# Patient Record
Sex: Female | Born: 1990 | Race: Asian | Hispanic: No | Marital: Single | Smoking: Never smoker
Health system: Southern US, Community
[De-identification: ages and names within clinical notes are randomized; demographics above are authoritative.]

---

## 2016-08-20 ENCOUNTER — Ambulatory Visit (INDEPENDENT_AMBULATORY_CARE_PROVIDER_SITE_OTHER): Payer: Managed Care, Other (non HMO) | Admitting: Physician Assistant

## 2016-08-20 ENCOUNTER — Ambulatory Visit (INDEPENDENT_AMBULATORY_CARE_PROVIDER_SITE_OTHER): Payer: Managed Care, Other (non HMO)

## 2016-08-20 ENCOUNTER — Encounter: Payer: Self-pay | Admitting: Physician Assistant

## 2016-08-20 VITALS — BP 116/80 | HR 79 | Temp 98.3°F | Resp 17 | Ht 60.5 in | Wt 137.0 lb

## 2016-08-20 DIAGNOSIS — R112 Nausea with vomiting, unspecified: Secondary | ICD-10-CM

## 2016-08-20 DIAGNOSIS — R0781 Pleurodynia: Secondary | ICD-10-CM

## 2016-08-20 DIAGNOSIS — R0789 Other chest pain: Secondary | ICD-10-CM | POA: Diagnosis not present

## 2016-08-20 LAB — POCT URINALYSIS DIP (MANUAL ENTRY)
BILIRUBIN UA: NEGATIVE
BILIRUBIN UA: NEGATIVE mg/dL
Blood, UA: NEGATIVE
GLUCOSE UA: NEGATIVE mg/dL
Nitrite, UA: NEGATIVE
Spec Grav, UA: 1.015 (ref 1.010–1.025)
Urobilinogen, UA: 0.2 E.U./dL
pH, UA: 7.5 (ref 5.0–8.0)

## 2016-08-20 LAB — POC MICROSCOPIC URINALYSIS (UMFC): MUCUS RE: ABSENT

## 2016-08-20 LAB — POCT URINE PREGNANCY: PREG TEST UR: NEGATIVE

## 2016-08-20 MED ORDER — OMEPRAZOLE 40 MG PO CPDR: 40.0000 mg | DELAYED_RELEASE_CAPSULE | Freq: Every day | ORAL | 1 refills | Status: AC

## 2016-08-20 MED ORDER — RANITIDINE HCL 150 MG PO TABS: 300.0000 mg | ORAL_TABLET | Freq: Every day | ORAL | 1 refills | Status: AC

## 2016-08-20 NOTE — Patient Instructions (Addendum)
I would like you to start omeprazole daily in the morning and zantac two tablets at night for the next 2 weeks for any possible underlying gastritis or ulcer. You may need additional therapy for 2 weeks but I want to see you in office at 2 weeks to see how you are doing. I would like you to use tylenol for musculoskeletal pain. I have ordered labs and will know the results in a few days. I will contact you with these lab results. I would also like to have an right upper quadrant ultrasound to ensure that your gallbladder is okay. Please let me know if any symptoms worsen over the next 2 weeks with this treatment, if so return sooner.    Peptic Ulcer A peptic ulcer is a painful sore in the lining of your esophagus, stomach, or the first part of your small intestine. You may have pain in the area between your chest and your belly button. The most common causes of an ulcer are:  An infection.  Using certain pain medicines too often or too much. Follow these instructions at home:  Avoid alcohol.  Avoid caffeine.  Do not use any tobacco products. These include cigarettes, chewing tobacco, and e-cigarettes. If you need help quitting, ask your doctor.  Take over-the-counter and prescription medicines only as told by your doctor. Do not stop or change your medicines unless you talk with your doctor about it first.  Keep all follow-up visits as told by your doctor. This is important. Contact a doctor if:  You do not get better in 7 days after you start treatment.  You keep having an upset stomach (indigestion) or heartburn. Get help right away if:  You have sudden, sharp pain in your belly (abdomen).  You have lasting belly pain.  You have bloody poop (stool) or black, tarry poop.  You throw up (vomit) blood. It may look like coffee grounds.  You feel light-headed or feel like you may pass out (faint).  You get weak.  You get sweaty or feel sticky and cold to the touch (clammy). This  information is not intended to replace advice given to you by your health care provider. Make sure you discuss any questions you have with your health care provider. Document Released: 06/24/2009 Document Revised: 08/14/2015 Document Reviewed: 12/29/2014 Elsevier Interactive Patient Education  2017 [employer]  Gastritis, Adult Gastritis is swelling (inflammation) of the stomach. When you have this condition, you can have these problems (symptoms):  Pain in your stomach.  A burning feeling in your stomach.  Feeling sick to your stomach (nauseous).  Throwing up (vomiting).  Feeling too full after you eat. It is important to get help for this condition. Without help, your stomach can bleed, and you can get sores (ulcers) in your stomach. Follow these instructions at home:  Take over-the-counter and prescription medicines only as told by your doctor.  If you were prescribed an antibiotic medicine, take it as told by your doctor. Do not stop taking it even if you start to feel better.  Drink enough fluid to keep your pee (urine) clear or pale yellow.  Instead of eating big meals, eat small meals often. Contact a health care provider if:  Your problems get worse.  Your problems go away and then come back. Get help right away if:  You throw up blood or something that looks like coffee grounds.  You have black or dark red poop (stools).  You cannot keep fluids down.  Your stomach pain gets worse.  You have a fever.  You do not feel better after 1 week. This information is not intended to replace advice given to you by your health care provider. Make sure you discuss any questions you have with your health care provider. Document Released: 09/16/2007 Document Revised: 11/27/2015 Document Reviewed: 12/22/2014 Elsevier Interactive Patient Education  2017 [employer].   IF you received an x-ray today, you will receive an invoice from [facility]. Please contact  [facility] at [phone] with questions or concerns regarding your invoice.   IF you received labwork today, you will receive an invoice from [place]. Please contact [facility] at [phone] with questions or concerns regarding your invoice.   Our billing staff will not be able to assist you with questions regarding bills from these companies.  You will be contacted with the lab results as soon as they are available. The fastest way to get your results is to activate your My Chart account. Instructions are located on the last page of this paperwork. If you have not heard from [place] regarding the results in 2 weeks, please contact this office.

## 2016-08-20 NOTE — Progress Notes (Signed)
Subjective:     Mary Ortega is a 26 y.o. female who presents for evaluation of lower rib pain and thoracic back pain. Onset was 4 weeks ago. Symptoms have been unchanged. The pain is described as sharp, and is 6/10 in intensity. Pain is located in the costovertebral angle bilaterally with radiation to back.  Aggravating factors: activity, sitting up, fatty foods and spicy foods.  Alleviating factors: none. Associated symptoms: belching, nausea and vomiting. Notes she feels nauseous after eating certain foods and will vomit up the food if she eats too much. The patient denies heavy lifting, abdominal pain, heartburn, constipation, diarrhea, dysuria, frequency, hematuria, hemetemisis, melena and sweats. Denies smoking. Has no FH of heart disease. She just recently moved here from [facility] (4 weeks ago). Saw a doctor in [place] a couple of months ago for the back pain and was treated with meloxicam but notes it did not help and actually made it worse. Denies alochoul use. Has tried heating pad with mild relief. Of note, pt's menstrual cycles are typically regular. They typically last 5-6 days. Denies menorrhagia and dysmenorrhea. LMP was 07/22/16. She is not sexually active, has never been sexually active.   There is a language barrier.   Review of Systems  Constitutional: Negative for chills, diaphoresis and fever.  Respiratory: Negative for cough, shortness of breath and wheezing.   Cardiovascular: Negative for palpitations and leg swelling.   Social History   Social History  . Marital status: Single    Spouse name: N/A  . Number of children: N/A  . Years of education: N/A   Occupational History  . Not on file.   Social History Main Topics  . Smoking status: Never Smoker  . Smokeless tobacco: Never Used  . Alcohol use No  . Drug use: No  . Sexual activity: Not on file   Other Topics Concern  . Not on file   Social History Narrative  . No narrative on file   History reviewed. No  pertinent surgical history.  History reviewed. No pertinent past medical history.   Objective:  Blood pressure 116/80, pulse 79, temperature 98.3 F (36.8 C), temperature source Oral, resp. rate 17, height 5' 0.5" (1.537 m), weight 137 lb (62.1 kg), last menstrual period 07/22/2016, SpO2 99 %.   Physical Exam  Constitutional: She is oriented to person, place, and time and well-developed, well-nourished, and in no distress.  HENT:  Head: Normocephalic and atraumatic.  Eyes: Conjunctivae are normal.  Neck: Normal range of motion.  Cardiovascular: Normal rate, regular rhythm and normal heart sounds.   Pulmonary/Chest: Effort normal and breath sounds normal. She exhibits tenderness and bony tenderness.    Abdominal: Soft. Normal appearance and bowel sounds are normal. She exhibits no distension. There is no tenderness. There is no rigidity, no guarding, no CVA tenderness, no tenderness at McBurney's point and negative Murphy's sign.  Musculoskeletal:       Thoracic back: She exhibits tenderness (of left sided musculature) and bony tenderness. She exhibits normal range of motion, no swelling and no spasm.  Neurological: She is alert and oriented to person, place, and time. Gait normal.  Skin: Skin is warm and dry.  Psychiatric: Affect normal.  Vitals reviewed.    Results for orders placed or performed in visit on 08/20/16 (from the past 24 hour(s))  POCT urinalysis dipstick     Status: Abnormal   Collection Time: 08/20/16  4:53 PM  Result Value Ref Range   Color, UA yellow yellow  Clarity, UA clear clear   Glucose, UA negative negative mg/dL   Bilirubin, UA negative negative   Ketones, POC UA negative negative mg/dL   Spec Grav, UA 1.015 1.010 - 1.025   Blood, UA negative negative   pH, UA 7.5 5.0 - 8.0   Protein Ur, POC trace (A) negative mg/dL   Urobilinogen, UA 0.2 0.2 or 1.0 E.U./dL   Nitrite, UA Negative Negative   Leukocytes, UA Trace (A) Negative  POCT urine pregnancy      Status: None   Collection Time: 08/20/16  4:53 PM  Result Value Ref Range   Preg Test, Ur Negative Negative  POCT Microscopic Urinalysis ([facility])     Status: Abnormal   Collection Time: 08/20/16  5:12 PM  Result Value Ref Range   WBC,UR,HPF,POC Moderate (A) None WBC/hpf   RBC,UR,HPF,POC None None RBC/hpf   Bacteria Many (A) None, Too numerous to count   Mucus Absent Absent   Epithelial Cells, UR Per Microscopy Moderate (A) None, Too numerous to count cells/hpf   Dg Chest 2 View  Result Date: 08/20/2016 CLINICAL DATA:  Pain with palpation. EXAM: CHEST  2 VIEW COMPARISON:  None. FINDINGS: The heart size and mediastinal contours are within normal limits. Both lungs are clear. The visualized skeletal structures are unremarkable. IMPRESSION: No active cardiopulmonary disease. Electronically Signed   By: Dorise Bullion III M.D   On: 08/20/2016 17:15    Assessment and Plan:  This case was precepted with Dr. Mitchel Honour.  1. Nausea and vomiting, intractability of vomiting not specified, unspecified vomiting type PE findings not consistent with symptom set. Unclear etiology for pt's symptoms. Differential includes musculoskeletal pain, GERD, PUD, gallbladder disease, gastritis, and other inflammatory processes. Will treat conservatively with PPI and H2 blocker to address symptom set. Instructed to avoid NSAID use. Take tylenol as needed for musculoskeletal pain. Plan to return in 2 weeks for further evaluation or sooner if symptoms worsen. - CBC with Differential/Platelet - CMP14+EGFR - Amylase - Lipase - POCT urinalysis dipstick - POCT Microscopic Urinalysis ([facility]) - POCT urine pregnancy - Urine culture - omeprazole (PRILOSEC) 40 MG capsule; Take 1 capsule (40 mg total) by mouth daily.  Dispense: 30 capsule; Refill: 1 - ranitidine (ZANTAC) 150 MG tablet; Take 2 tablets (300 mg total) by mouth at bedtime.  Dispense: 60 tablet; Refill: 1 - US Abdomen Limited RUQ; Future  2. Sternum pain - DG  Chest 2 View; Future 3. Rib pain - DG Chest 2 View; Future

## 2016-08-21 LAB — CBC WITH DIFFERENTIAL/PLATELET
BASOS ABS: 0 10*3/uL (ref 0.0–0.2)
Basos: 0 %
EOS (ABSOLUTE): 0.3 10*3/uL (ref 0.0–0.4)
Eos: 4 %
Hematocrit: 35 % (ref 34.0–46.6)
Hemoglobin: 11.4 g/dL (ref 11.1–15.9)
IMMATURE GRANULOCYTES: 0 %
Immature Grans (Abs): 0 10*3/uL (ref 0.0–0.1)
Lymphocytes Absolute: 2.2 10*3/uL (ref 0.7–3.1)
Lymphs: 30 %
MCH: 25.6 pg — ABNORMAL LOW (ref 26.6–33.0)
MCHC: 32.6 g/dL (ref 31.5–35.7)
MCV: 79 fL (ref 79–97)
Monocytes Absolute: 0.5 10*3/uL (ref 0.1–0.9)
Monocytes: 7 %
NEUTROS PCT: 59 %
Neutrophils Absolute: 4.3 10*3/uL (ref 1.4–7.0)
PLATELETS: 353 10*3/uL (ref 150–379)
RBC: 4.46 x10E6/uL (ref 3.77–5.28)
RDW: 13.7 % (ref 12.3–15.4)
WBC: 7.3 10*3/uL (ref 3.4–10.8)

## 2016-08-21 LAB — CMP14+EGFR
A/G RATIO: 1.3 (ref 1.2–2.2)
ALT: 21 IU/L (ref 0–32)
AST: 21 IU/L (ref 0–40)
Albumin: 4.5 g/dL (ref 3.5–5.5)
Alkaline Phosphatase: 89 IU/L (ref 39–117)
BUN/Creatinine Ratio: 14 (ref 9–23)
BUN: 10 mg/dL (ref 6–20)
Bilirubin Total: <0.2 mg/dL (ref 0.0–1.2)
CALCIUM: 9.5 mg/dL (ref 8.7–10.2)
CO2: 25 mmol/L (ref 18–29)
Chloride: 99 mmol/L (ref 96–106)
Creatinine, Ser: 0.7 mg/dL (ref 0.57–1.00)
GFR calc Af Amer: 139 mL/min/{1.73_m2} (ref >59)
GFR, EST NON AFRICAN AMERICAN: 121 mL/min/{1.73_m2} (ref >59)
GLUCOSE: 84 mg/dL (ref 65–99)
Globulin, Total: 3.5 g/dL (ref 1.5–4.5)
POTASSIUM: 4.8 mmol/L (ref 3.5–5.2)
Sodium: 137 mmol/L (ref 134–144)
TOTAL PROTEIN: 8 g/dL (ref 6.0–8.5)

## 2016-08-21 LAB — URINE CULTURE

## 2016-08-21 LAB — LIPASE: LIPASE: 64 U/L (ref 14–72)

## 2016-08-21 LAB — AMYLASE: Amylase: 150 U/L — ABNORMAL HIGH (ref 31–124)

## 2016-08-22 ENCOUNTER — Telehealth: Payer: Self-pay | Admitting: Physician Assistant

## 2016-08-22 NOTE — Telephone Encounter (Signed)
Pt states that she is burping a lot and she is not sure if it is a reaction from the medications.   Pt contact phone number [phone]

## 2016-08-26 NOTE — Telephone Encounter (Signed)
Pt states, burping sx's has resolved  Advised to call clinic if sx's restart

## 2016-09-02 ENCOUNTER — Ambulatory Visit
Admission: RE | Admit: 2016-09-02 | Discharge: 2016-09-02 | Disposition: A | Discharge status: Home / Self Care | Payer: Managed Care, Other (non HMO) | Source: Ambulatory Visit | Attending: Physician Assistant | Admitting: Physician Assistant

## 2016-09-02 DIAGNOSIS — R112 Nausea with vomiting, unspecified: Secondary | ICD-10-CM

## 2016-09-08 ENCOUNTER — Encounter: Payer: Self-pay | Admitting: Physician Assistant

## 2016-09-11 ENCOUNTER — Encounter: Payer: Self-pay | Admitting: Physician Assistant

## 2017-08-29 IMAGING — US US ABDOMEN LIMITED
1 series · 14 of 25 positions shown · non-contrast
Comparison: None.

CLINICAL DATA: Nausea vomiting.  Epigastric pain.

EXAM:
US ABDOMEN LIMITED - RIGHT UPPER QUADRANT

[Series 1: us abdomen limited · 0.17mm/px · 14 of 44 slices shown]
[im 1/44]
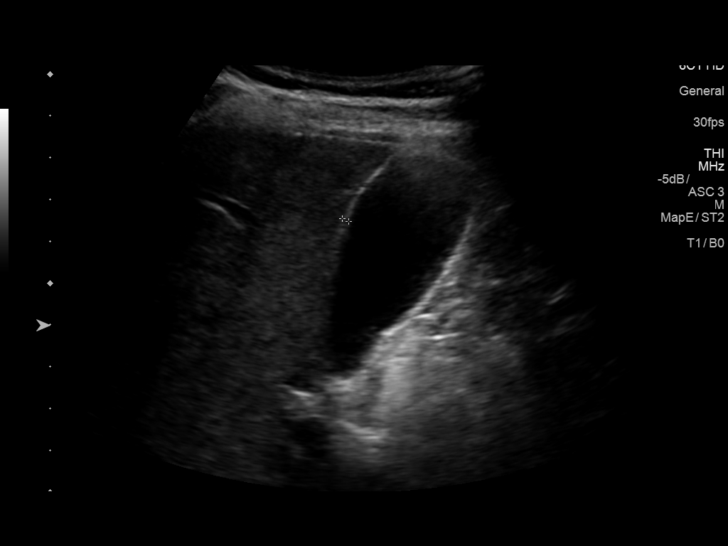
[im 4/44]
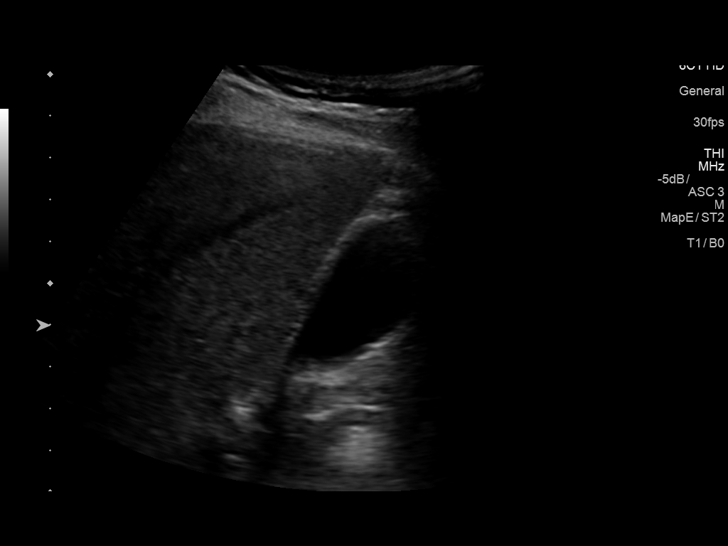
[im 8/44]
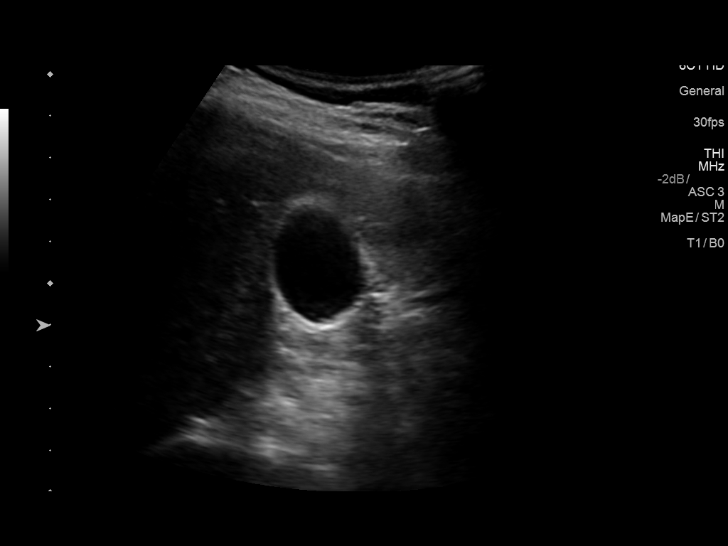
[im 11/44]
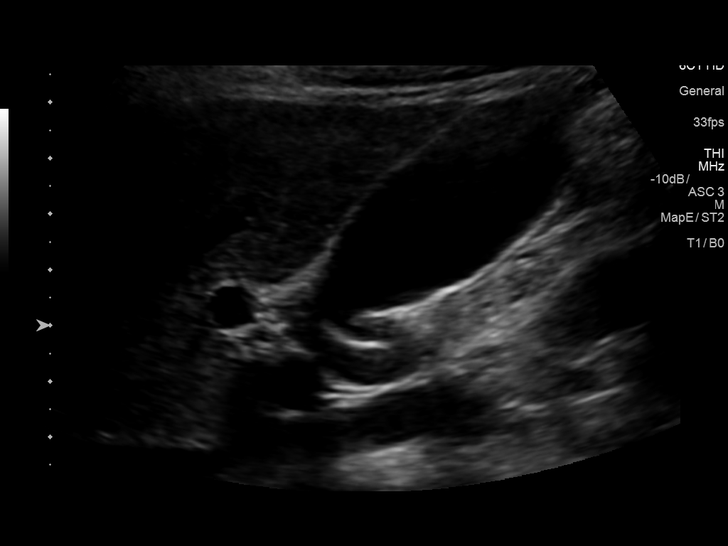
[im 15/44]
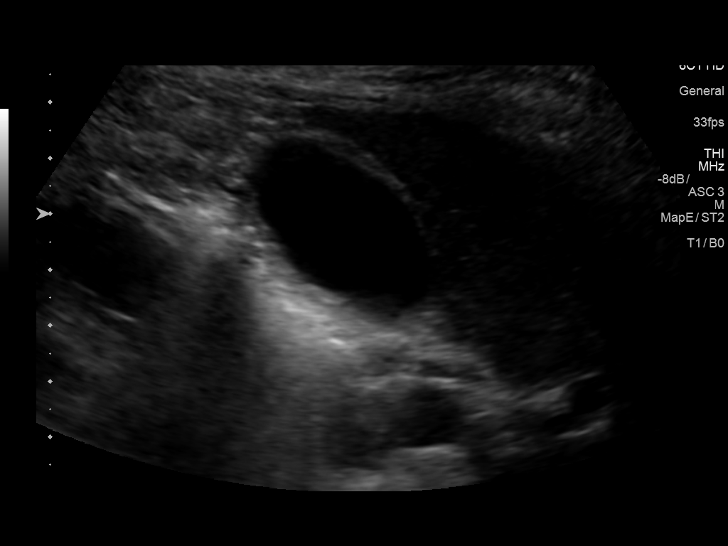
[im 17/44]
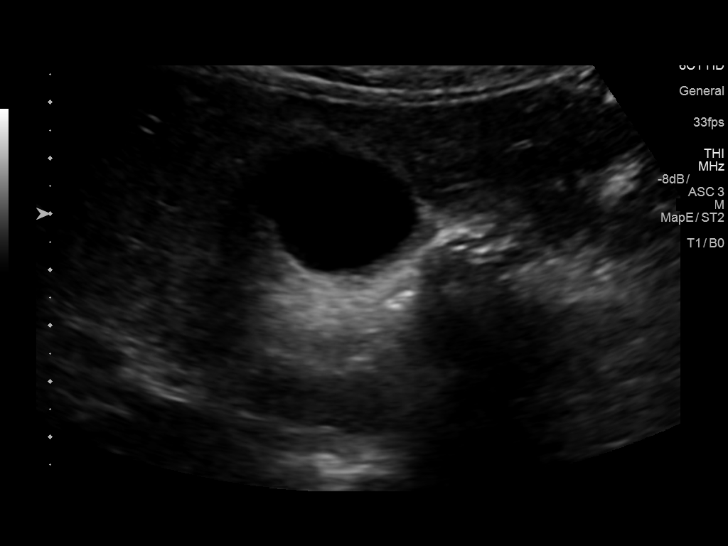
[im 20/44]
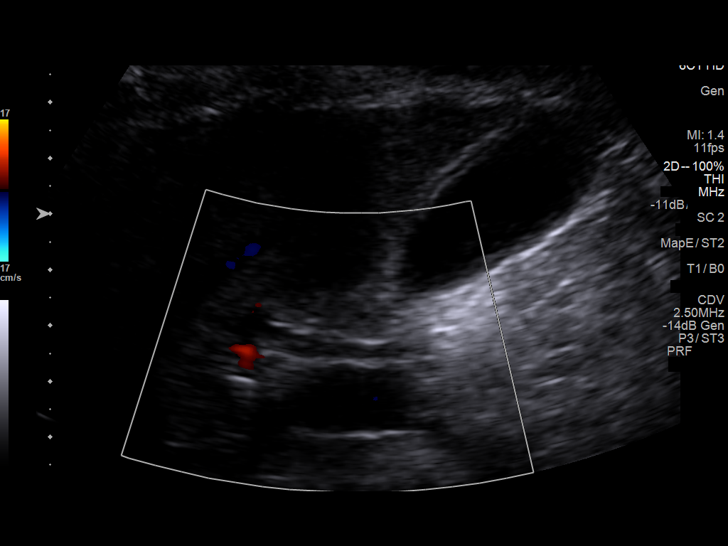
[im 24/44]
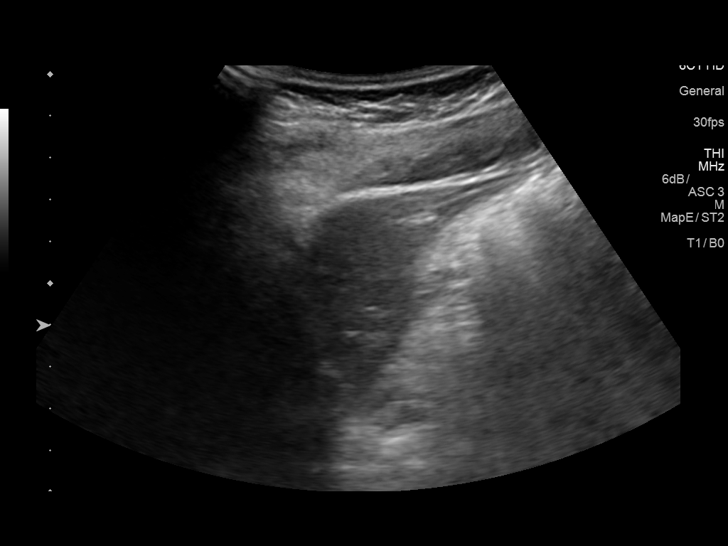
[im 27/44]
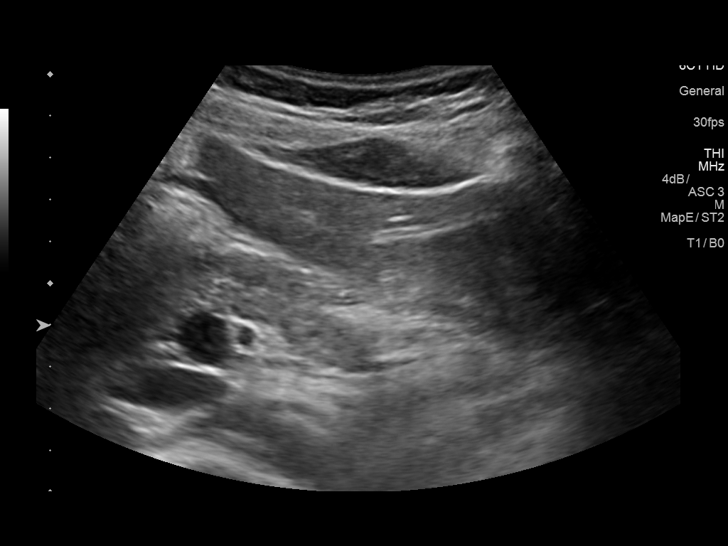
[im 29/44]
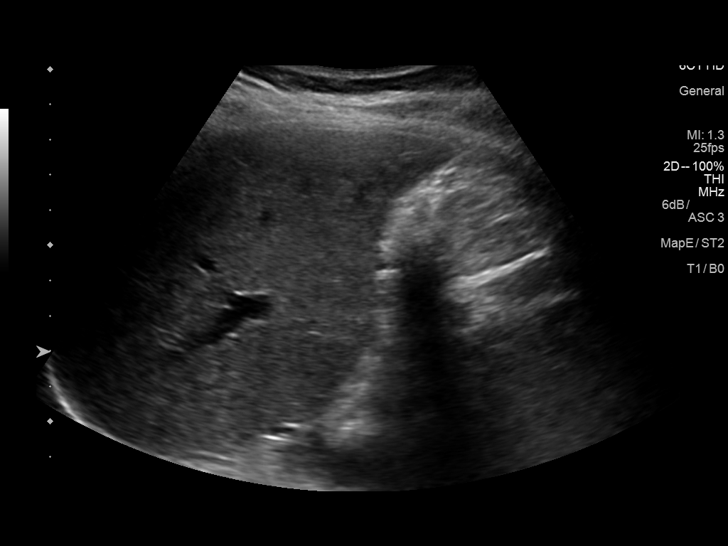
[im 33/44]
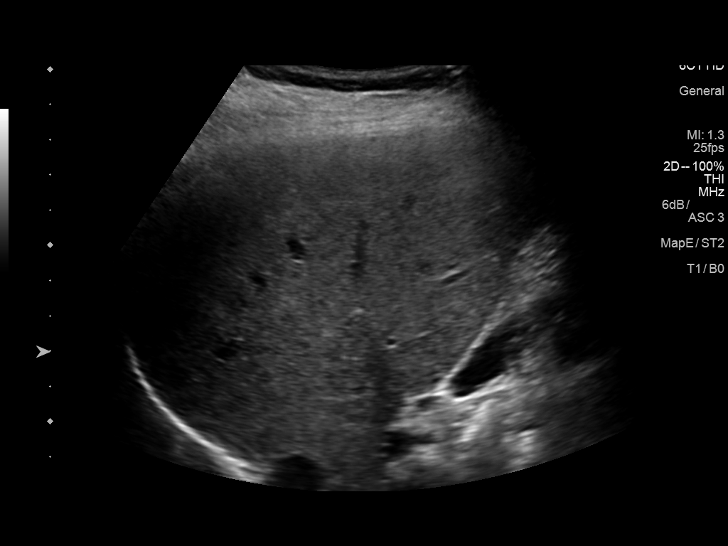
[im 36/44]
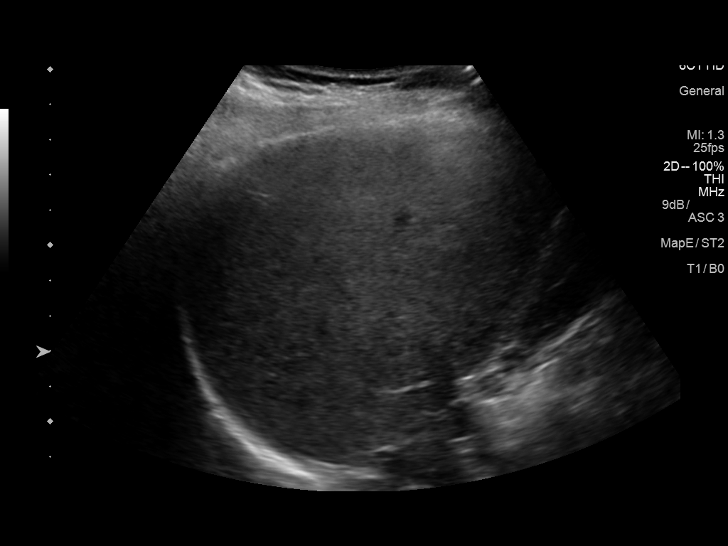
[im 40/44]
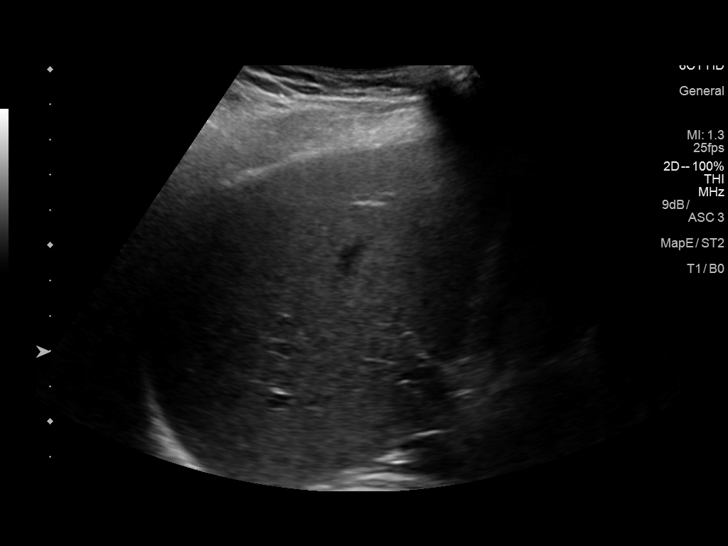
[im 44/44]
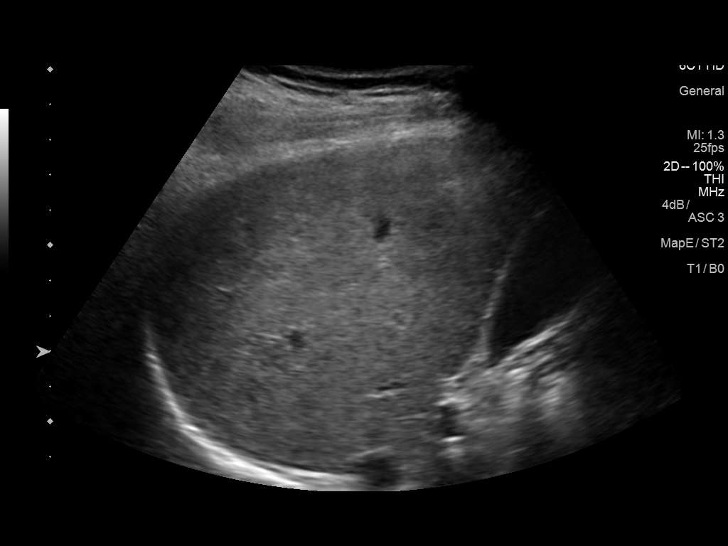

[14 of 25 positions shown; findings below may reference images not displayed]

FINDINGS: Gallbladder:

No gallstones or wall thickening visualized. No sonographic Murphy
sign noted by sonographer.

Common bile duct:

Diameter: 3.4 mm

Liver:

No focal lesion identified. Within normal limits in parenchymal
echogenicity.
IMPRESSION: No acute abnormality identified.
